# Patient Record
Sex: Male | Born: 1945
Health system: Southern US, Community
[De-identification: ages and names within clinical notes are randomized; demographics above are authoritative.]

## PROBLEM LIST (undated history)

## (undated) DIAGNOSIS — I10 Essential (primary) hypertension: Secondary | ICD-10-CM

## (undated) HISTORY — PX: OTHER SURGICAL HISTORY: SHX169

## (undated) HISTORY — PX: BACK SURGERY: SHX140

---

## 1997-10-13 ENCOUNTER — Ambulatory Visit (HOSPITAL_COMMUNITY): Admission: RE | Admit: 1997-10-13 | Discharge: 1997-10-13 | Payer: Self-pay | Admitting: *Deleted

## 1997-10-27 ENCOUNTER — Ambulatory Visit (HOSPITAL_COMMUNITY): Admission: RE | Admit: 1997-10-27 | Discharge: 1997-10-27 | Payer: Self-pay | Admitting: *Deleted

## 1997-10-27 ENCOUNTER — Encounter: Payer: Self-pay | Admitting: *Deleted

## 1997-11-10 ENCOUNTER — Ambulatory Visit (HOSPITAL_COMMUNITY): Admission: RE | Admit: 1997-11-10 | Discharge: 1997-11-10 | Payer: Self-pay | Admitting: *Deleted

## 1997-11-10 ENCOUNTER — Encounter: Payer: Self-pay | Admitting: *Deleted

## 2003-03-25 ENCOUNTER — Encounter: Admission: RE | Admit: 2003-03-25 | Discharge: 2003-03-25 | Payer: Self-pay | Admitting: Orthopaedic Surgery

## 2003-04-13 ENCOUNTER — Encounter: Admission: RE | Admit: 2003-04-13 | Discharge: 2003-04-13 | Payer: Self-pay | Admitting: Orthopaedic Surgery

## 2003-07-01 ENCOUNTER — Ambulatory Visit (HOSPITAL_COMMUNITY): Admission: RE | Admit: 2003-07-01 | Discharge: 2003-07-01 | Payer: Self-pay | Admitting: Surgery

## 2003-07-01 ENCOUNTER — Ambulatory Visit (HOSPITAL_BASED_OUTPATIENT_CLINIC_OR_DEPARTMENT_OTHER): Admission: RE | Admit: 2003-07-01 | Discharge: 2003-07-01 | Payer: Self-pay | Admitting: Surgery

## 2005-05-03 ENCOUNTER — Encounter: Payer: Self-pay | Admitting: *Deleted

## 2010-01-18 ENCOUNTER — Encounter: Payer: Self-pay | Admitting: Gastroenterology

## 2010-06-27 NOTE — Letter (Signed)
Summary: Colonoscopy Letter  [facility]  [address]   [place], [place] [address]   Phone: [phone]  Fax: [phone]      January 18, 2010 MRN: [id number]   [facility] [address] [place], [place]  [address]   Dear Eric Rogers,   According to your medical record, it is time for you to schedule a Colonoscopy. The American Cancer Society recommends this procedure as a method to detect early colon cancer. Patients with a family history of colon cancer, or a personal history of colon polyps or inflammatory bowel disease are at increased risk.  This letter has been generated based on the recommendations made at the time of your procedure. If you feel that in your particular situation this may no longer apply, please contact our office.  Please call our office at [phone] to schedule this appointment or to update your records at your earliest convenience.  Thank you for cooperating with [place] to provide you with the very best care possible.   Sincerely,   Vania Rea. Jarold Motto, M.D.  [facility] [phone]

## 2011-09-26 ENCOUNTER — Encounter: Payer: Self-pay | Admitting: Gastroenterology

## 2014-03-19 DIAGNOSIS — L812 Freckles: Secondary | ICD-10-CM | POA: Diagnosis not present

## 2014-03-19 DIAGNOSIS — L570 Actinic keratosis: Secondary | ICD-10-CM | POA: Diagnosis not present

## 2014-03-19 DIAGNOSIS — L821 Other seborrheic keratosis: Secondary | ICD-10-CM | POA: Diagnosis not present

## 2014-03-19 DIAGNOSIS — D1801 Hemangioma of skin and subcutaneous tissue: Secondary | ICD-10-CM | POA: Diagnosis not present

## 2014-03-19 DIAGNOSIS — Z85828 Personal history of other malignant neoplasm of skin: Secondary | ICD-10-CM | POA: Diagnosis not present

## 2014-03-19 DIAGNOSIS — L400 Psoriasis vulgaris: Secondary | ICD-10-CM | POA: Diagnosis not present

## 2014-05-20 DIAGNOSIS — M7580 Other shoulder lesions, unspecified shoulder: Secondary | ICD-10-CM | POA: Diagnosis not present

## 2014-05-25 DIAGNOSIS — M25511 Pain in right shoulder: Secondary | ICD-10-CM | POA: Diagnosis not present

## 2014-06-08 DIAGNOSIS — M25511 Pain in right shoulder: Secondary | ICD-10-CM | POA: Diagnosis not present

## 2014-09-09 DIAGNOSIS — H8110 Benign paroxysmal vertigo, unspecified ear: Secondary | ICD-10-CM | POA: Diagnosis not present

## 2014-12-07 DIAGNOSIS — I10 Essential (primary) hypertension: Secondary | ICD-10-CM | POA: Diagnosis not present

## 2014-12-07 DIAGNOSIS — Z1159 Encounter for screening for other viral diseases: Secondary | ICD-10-CM | POA: Diagnosis not present

## 2014-12-07 DIAGNOSIS — Z136 Encounter for screening for cardiovascular disorders: Secondary | ICD-10-CM | POA: Diagnosis not present

## 2014-12-07 DIAGNOSIS — M25511 Pain in right shoulder: Secondary | ICD-10-CM | POA: Diagnosis not present

## 2014-12-07 DIAGNOSIS — Z125 Encounter for screening for malignant neoplasm of prostate: Secondary | ICD-10-CM | POA: Diagnosis not present

## 2014-12-07 DIAGNOSIS — Z23 Encounter for immunization: Secondary | ICD-10-CM | POA: Diagnosis not present

## 2014-12-07 DIAGNOSIS — Z0000 Encounter for general adult medical examination without abnormal findings: Secondary | ICD-10-CM | POA: Diagnosis not present

## 2015-01-06 DIAGNOSIS — M545 Low back pain: Secondary | ICD-10-CM | POA: Diagnosis not present

## 2015-01-25 DIAGNOSIS — M545 Low back pain: Secondary | ICD-10-CM | POA: Diagnosis not present

## 2015-02-08 DIAGNOSIS — M5116 Intervertebral disc disorders with radiculopathy, lumbar region: Secondary | ICD-10-CM | POA: Diagnosis not present

## 2015-02-22 DIAGNOSIS — M545 Low back pain: Secondary | ICD-10-CM | POA: Diagnosis not present

## 2015-02-22 DIAGNOSIS — M25511 Pain in right shoulder: Secondary | ICD-10-CM | POA: Diagnosis not present

## 2015-02-22 DIAGNOSIS — M5116 Intervertebral disc disorders with radiculopathy, lumbar region: Secondary | ICD-10-CM | POA: Diagnosis not present

## 2015-03-08 DIAGNOSIS — M5116 Intervertebral disc disorders with radiculopathy, lumbar region: Secondary | ICD-10-CM | POA: Diagnosis not present

## 2015-03-22 DIAGNOSIS — M545 Low back pain: Secondary | ICD-10-CM | POA: Diagnosis not present

## 2015-04-01 DIAGNOSIS — Z0100 Encounter for examination of eyes and vision without abnormal findings: Secondary | ICD-10-CM | POA: Diagnosis not present

## 2015-04-08 ENCOUNTER — Other Ambulatory Visit: Payer: Self-pay | Admitting: Orthopaedic Surgery

## 2015-04-08 DIAGNOSIS — M545 Low back pain: Secondary | ICD-10-CM

## 2015-04-16 ENCOUNTER — Ambulatory Visit
Admission: RE | Admit: 2015-04-16 | Discharge: 2015-04-16 | Disposition: A | Discharge status: Home / Self Care | Payer: Commercial Managed Care - HMO | Source: Ambulatory Visit | Attending: Orthopaedic Surgery | Admitting: Orthopaedic Surgery

## 2015-04-16 DIAGNOSIS — M545 Low back pain: Secondary | ICD-10-CM

## 2015-04-16 DIAGNOSIS — M5126 Other intervertebral disc displacement, lumbar region: Secondary | ICD-10-CM | POA: Diagnosis not present

## 2015-04-30 DIAGNOSIS — M545 Low back pain: Secondary | ICD-10-CM | POA: Diagnosis not present

## 2015-09-20 DIAGNOSIS — L570 Actinic keratosis: Secondary | ICD-10-CM | POA: Diagnosis not present

## 2015-09-30 DIAGNOSIS — L820 Inflamed seborrheic keratosis: Secondary | ICD-10-CM | POA: Diagnosis not present

## 2015-09-30 DIAGNOSIS — D2321 Other benign neoplasm of skin of right ear and external auricular canal: Secondary | ICD-10-CM | POA: Diagnosis not present

## 2015-09-30 DIAGNOSIS — C4442 Squamous cell carcinoma of skin of scalp and neck: Secondary | ICD-10-CM | POA: Diagnosis not present

## 2015-09-30 DIAGNOSIS — D485 Neoplasm of uncertain behavior of skin: Secondary | ICD-10-CM | POA: Diagnosis not present

## 2015-09-30 DIAGNOSIS — Z85828 Personal history of other malignant neoplasm of skin: Secondary | ICD-10-CM | POA: Diagnosis not present

## 2015-12-09 DIAGNOSIS — Z23 Encounter for immunization: Secondary | ICD-10-CM | POA: Diagnosis not present

## 2015-12-09 DIAGNOSIS — Z125 Encounter for screening for malignant neoplasm of prostate: Secondary | ICD-10-CM | POA: Diagnosis not present

## 2015-12-09 DIAGNOSIS — Z0000 Encounter for general adult medical examination without abnormal findings: Secondary | ICD-10-CM | POA: Diagnosis not present

## 2015-12-09 DIAGNOSIS — I10 Essential (primary) hypertension: Secondary | ICD-10-CM | POA: Diagnosis not present

## 2016-01-31 DIAGNOSIS — Z85828 Personal history of other malignant neoplasm of skin: Secondary | ICD-10-CM | POA: Diagnosis not present

## 2016-01-31 DIAGNOSIS — L820 Inflamed seborrheic keratosis: Secondary | ICD-10-CM | POA: Diagnosis not present

## 2016-01-31 DIAGNOSIS — L570 Actinic keratosis: Secondary | ICD-10-CM | POA: Diagnosis not present

## 2016-08-24 IMAGING — MR MR LUMBAR SPINE W/O CM
5 series · 40 of 48 positions shown · non-contrast
Comparison: MRI dated 09/22/2010

CLINICAL DATA: Chronic low back pain. Numbness and pain in both
legs, right more than left.

EXAM:
MRI LUMBAR SPINE WITHOUT CONTRAST
TECHNIQUE: Multiplanar, multisequence MR imaging of the lumbar spine was
performed. No intravenous contrast was administered.

[Series 3: T2 · sagittal · 4.0mm · 0.91mm/px · 7 of 15 slices shown (1 of 2)]
[im 1/15]
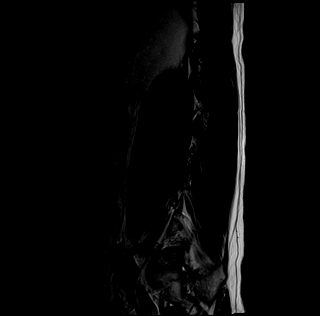
[im 3/15]
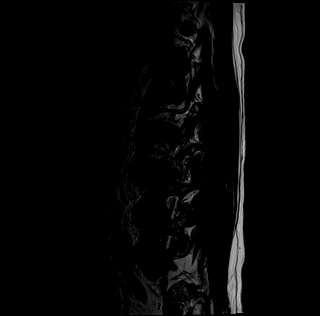
[im 5/15]
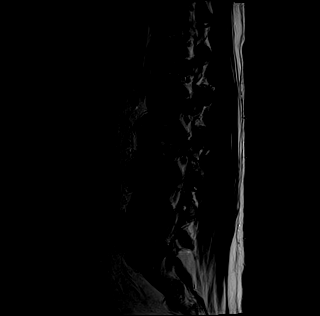
[im 8/15]
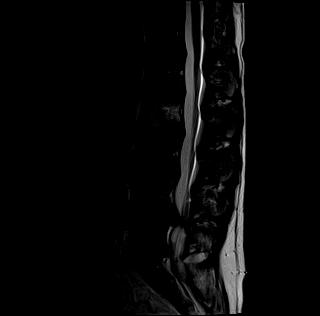
[im 10/15]
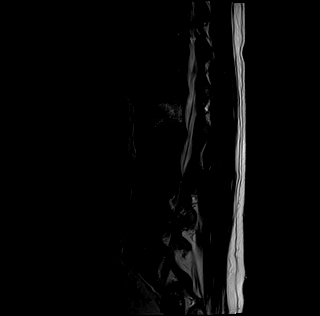
[im 12/15]
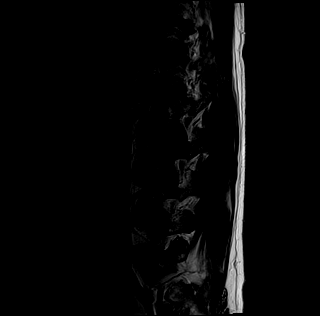
[im 15/15]
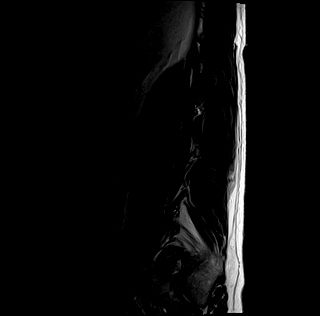

[Series 4: tirm sag · sagittal · 4.0mm · 0.55mm/px · 7 of 15 slices shown]
[im 1/15]
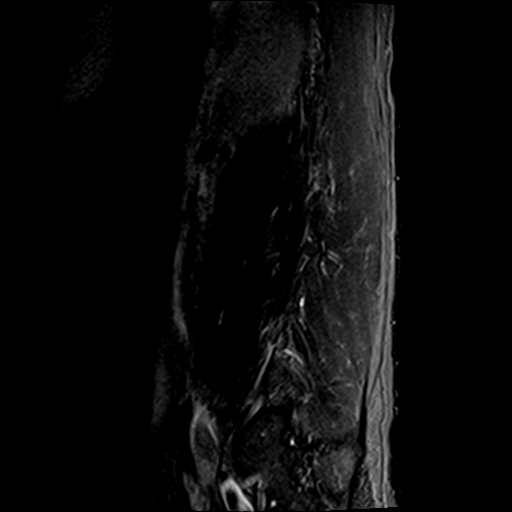
[im 3/15]
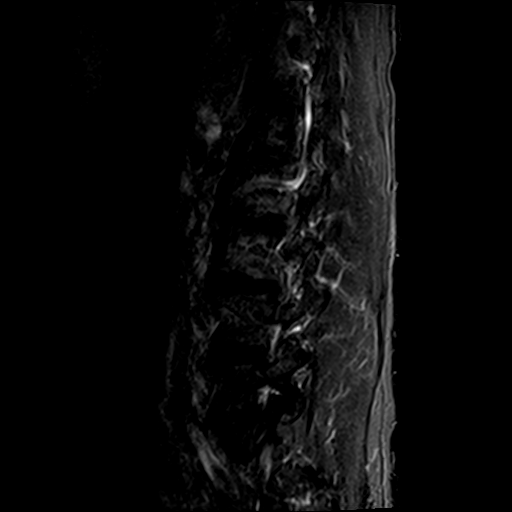
[im 5/15]
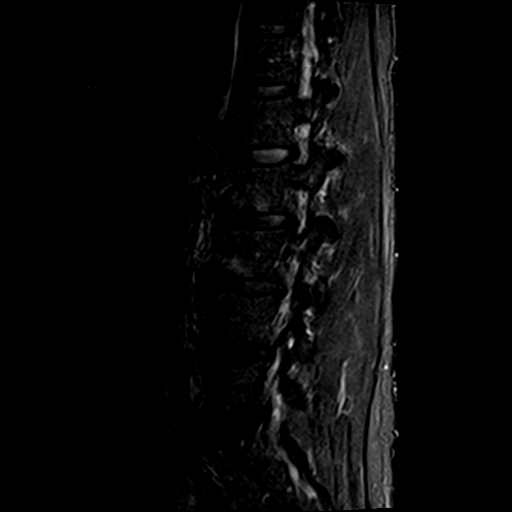
[im 8/15]
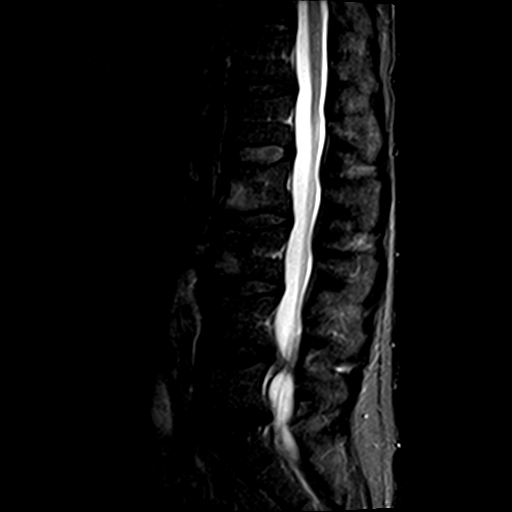
[im 10/15]
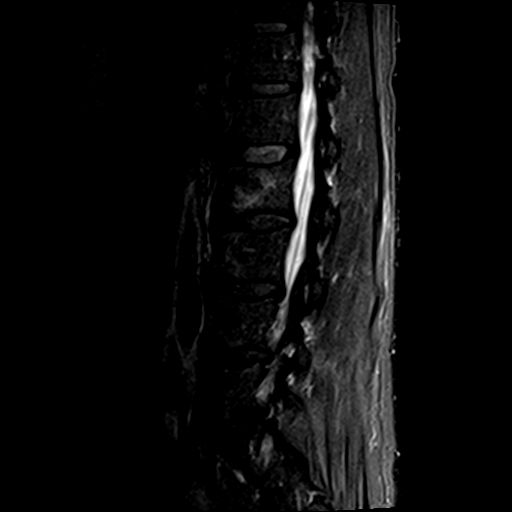
[im 12/15]
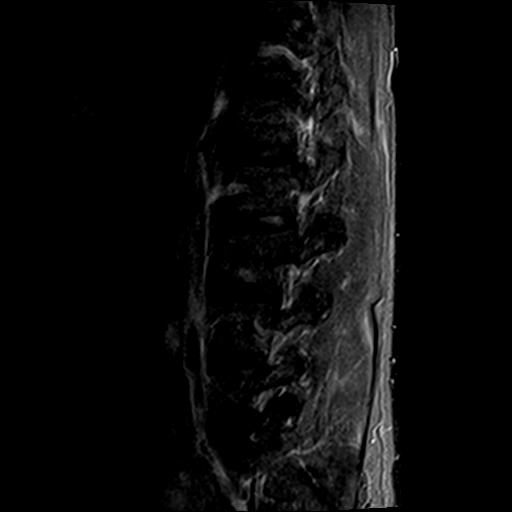
[im 15/15]
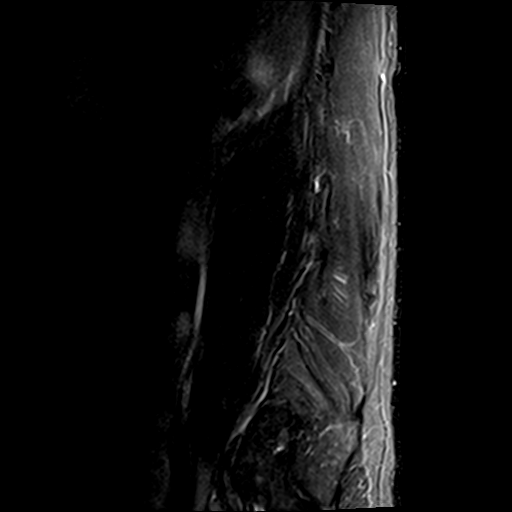

[Series 5: T1 · sagittal · 4.0mm · 0.91mm/px · 6 of 15 slices shown (1 of 2)]
[im 1/15]
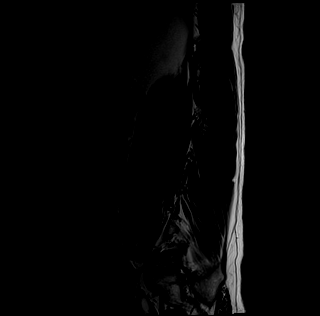
[im 3/15]
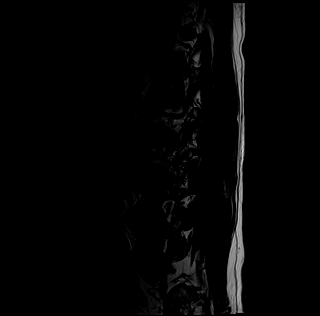
[im 6/15]
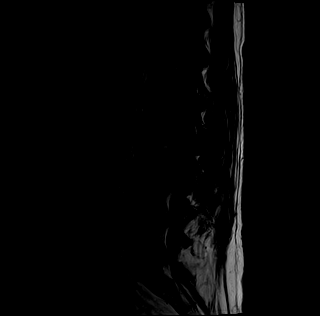
[im 9/15]
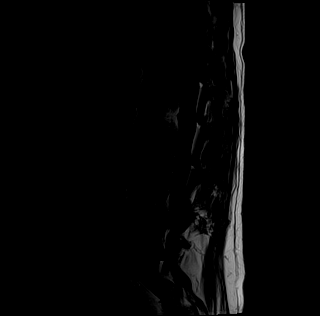
[im 12/15]
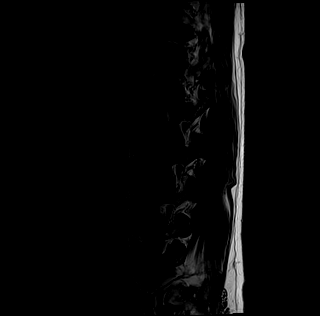
[im 15/15]
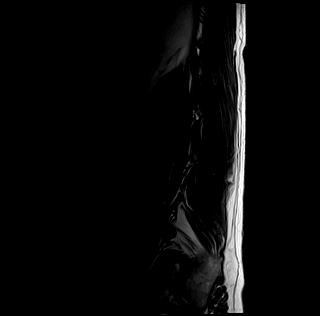

[Series 6: T1 · axial · 4.0mm · 0.70mm/px · z∈[-53,+121]mm · 8 of 34 slices shown (2 of 2)]
[im 1/34]
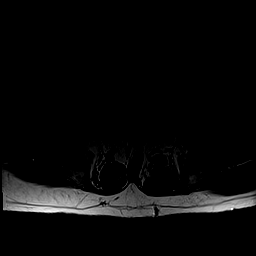
[im 6/34]
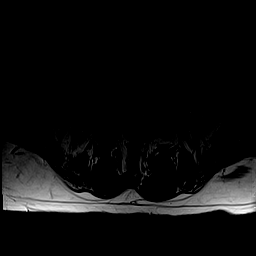
[im 11/34]
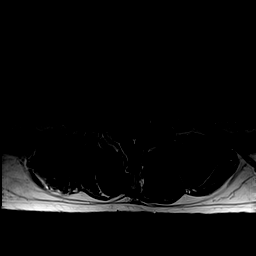
[im 16/34]
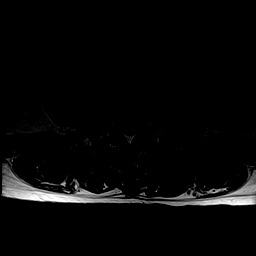
[im 18/34]
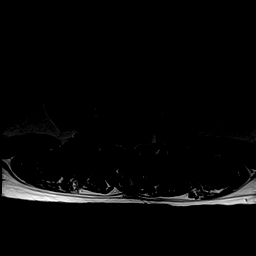
[im 23/34]
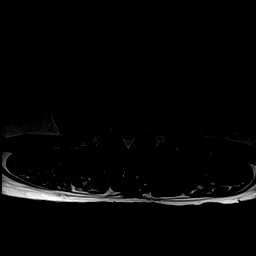
[im 28/34]
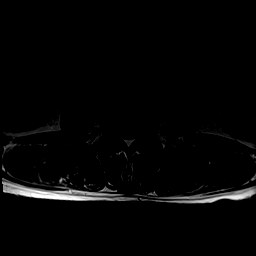
[im 34/34]
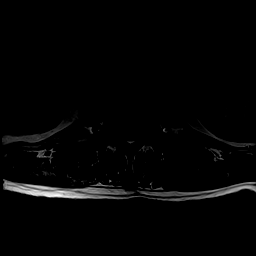

[Series 7: T2 · axial · 4.0mm · 0.70mm/px · z∈[-53,+121]mm · 12 of 34 slices shown (2 of 2)]
[im 1/34]
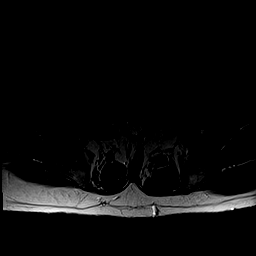
[im 3/34]
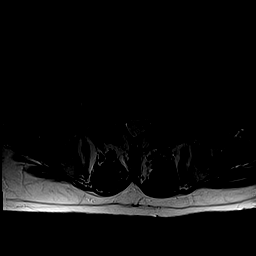
[im 6/34]
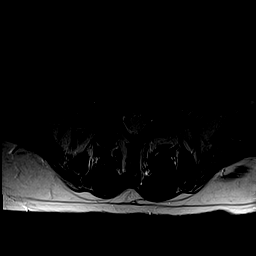
[im 8/34]
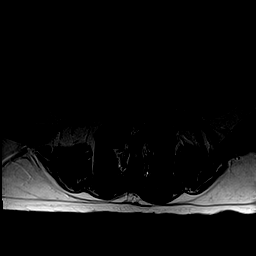
[im 11/34]
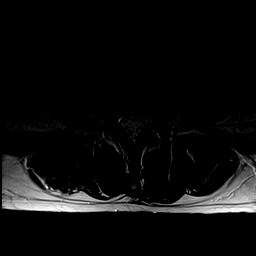
[im 13/34]
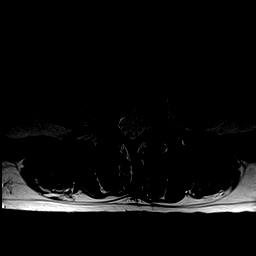
[im 16/34]
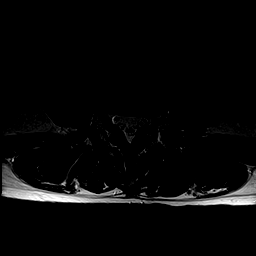
[im 18/34]
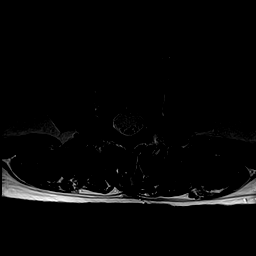
[im 21/34]
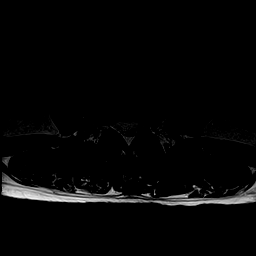
[im 23/34]
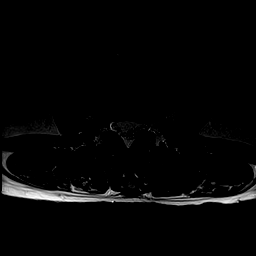
[im 28/34]
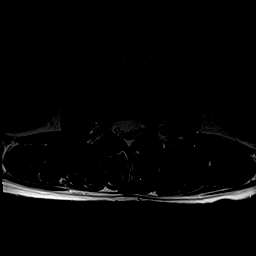
[im 34/34]
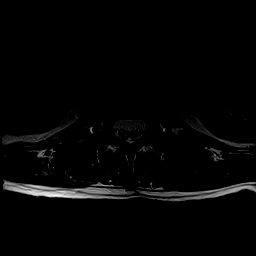

[40 of 48 positions shown; findings below may reference images not displayed]

FINDINGS: Normal conus tip at L1.  Normal paraspinal soft tissues.

T11-12 and T12-L1: Minimal desiccation of the discs. Tiny central
disc bulge at T12-L1 with no neural impingement.

L1-2:  Normal disc.  Chronic benign hemangioma in the L2 vertebra.

L2-3: Tiny broad-based disc bulge with a new tiny disc protrusion
into the left neural foramen slightly compressing the left side of
the thecal sac. This is immediately adjacent to the left L3 nerve
rootlets. Minimal degenerative changes of the facet joints, stable.

L3-4: New small disc bulge into the left neural foramen with
narrowing of the left lateral recess immediately adjacent left L4
nerve rootlets. Minimal stable degenerative changes of the facet
joints.

L4-5: New prominent soft disc extrusion central and to the right
extending inferiorly in the right side of the spinal canal severely
compressing the thecal sac to the right and left of midline
compressing the low L5 nerves in more severely compressing the renal
right L5 and S1 nerve rootlets. Minimal degenerative changes of the
facet joints, stable.

L5-S1: Chronic disc space narrowing with a chronic 6 mm
retrolisthesis. Small broad-based bulge of the uncovered disc
slightly compressing the right S1 nerve root. This is unchanged.
IMPRESSION: 1. New prominent soft disc extrusion central and to the right and
left at L4-5 extending inferiorly on the right behind the body of L5
compressing both L5 nerves and the right S1 nerve and markedly
compressing the thecal sac.
2. New small soft disc protrusion at L2-3 with slight impingement
upon the left lateral recess of the thecal sac.
3. Increased disc bulge to the left at L3-4 with slight compression
of the left lateral recess of the thecal sac.
4. Chronic slight compression of the right S1 nerve root sleeve at
L5-S1.

## 2016-10-12 DIAGNOSIS — H524 Presbyopia: Secondary | ICD-10-CM | POA: Diagnosis not present

## 2016-12-20 DIAGNOSIS — Z125 Encounter for screening for malignant neoplasm of prostate: Secondary | ICD-10-CM | POA: Diagnosis not present

## 2016-12-20 DIAGNOSIS — Z0000 Encounter for general adult medical examination without abnormal findings: Secondary | ICD-10-CM | POA: Diagnosis not present

## 2016-12-20 DIAGNOSIS — Z136 Encounter for screening for cardiovascular disorders: Secondary | ICD-10-CM | POA: Diagnosis not present

## 2016-12-20 DIAGNOSIS — L409 Psoriasis, unspecified: Secondary | ICD-10-CM | POA: Diagnosis not present

## 2016-12-20 DIAGNOSIS — I10 Essential (primary) hypertension: Secondary | ICD-10-CM | POA: Diagnosis not present

## 2017-01-30 DIAGNOSIS — D2371 Other benign neoplasm of skin of right lower limb, including hip: Secondary | ICD-10-CM | POA: Diagnosis not present

## 2017-01-30 DIAGNOSIS — L821 Other seborrheic keratosis: Secondary | ICD-10-CM | POA: Diagnosis not present

## 2017-01-30 DIAGNOSIS — L853 Xerosis cutis: Secondary | ICD-10-CM | POA: Diagnosis not present

## 2017-01-30 DIAGNOSIS — D1801 Hemangioma of skin and subcutaneous tissue: Secondary | ICD-10-CM | POA: Diagnosis not present

## 2017-01-30 DIAGNOSIS — D225 Melanocytic nevi of trunk: Secondary | ICD-10-CM | POA: Diagnosis not present

## 2017-01-30 DIAGNOSIS — D2262 Melanocytic nevi of left upper limb, including shoulder: Secondary | ICD-10-CM | POA: Diagnosis not present

## 2017-01-30 DIAGNOSIS — L400 Psoriasis vulgaris: Secondary | ICD-10-CM | POA: Diagnosis not present

## 2017-01-30 DIAGNOSIS — D2261 Melanocytic nevi of right upper limb, including shoulder: Secondary | ICD-10-CM | POA: Diagnosis not present

## 2017-01-30 DIAGNOSIS — Z85828 Personal history of other malignant neoplasm of skin: Secondary | ICD-10-CM | POA: Diagnosis not present

## 2017-10-18 DIAGNOSIS — H524 Presbyopia: Secondary | ICD-10-CM | POA: Diagnosis not present

## 2018-01-03 DIAGNOSIS — I10 Essential (primary) hypertension: Secondary | ICD-10-CM | POA: Diagnosis not present

## 2018-01-03 DIAGNOSIS — E782 Mixed hyperlipidemia: Secondary | ICD-10-CM | POA: Diagnosis not present

## 2018-01-03 DIAGNOSIS — Z125 Encounter for screening for malignant neoplasm of prostate: Secondary | ICD-10-CM | POA: Diagnosis not present

## 2018-01-03 DIAGNOSIS — Z0000 Encounter for general adult medical examination without abnormal findings: Secondary | ICD-10-CM | POA: Diagnosis not present

## 2018-01-29 DIAGNOSIS — L218 Other seborrheic dermatitis: Secondary | ICD-10-CM | POA: Diagnosis not present

## 2018-01-29 DIAGNOSIS — L821 Other seborrheic keratosis: Secondary | ICD-10-CM | POA: Diagnosis not present

## 2018-01-29 DIAGNOSIS — L570 Actinic keratosis: Secondary | ICD-10-CM | POA: Diagnosis not present

## 2018-01-29 DIAGNOSIS — D1801 Hemangioma of skin and subcutaneous tissue: Secondary | ICD-10-CM | POA: Diagnosis not present

## 2018-01-29 DIAGNOSIS — D485 Neoplasm of uncertain behavior of skin: Secondary | ICD-10-CM | POA: Diagnosis not present

## 2018-01-29 DIAGNOSIS — D2371 Other benign neoplasm of skin of right lower limb, including hip: Secondary | ICD-10-CM | POA: Diagnosis not present

## 2018-01-29 DIAGNOSIS — L820 Inflamed seborrheic keratosis: Secondary | ICD-10-CM | POA: Diagnosis not present

## 2018-01-29 DIAGNOSIS — Z85828 Personal history of other malignant neoplasm of skin: Secondary | ICD-10-CM | POA: Diagnosis not present

## 2018-01-29 DIAGNOSIS — C44319 Basal cell carcinoma of skin of other parts of face: Secondary | ICD-10-CM | POA: Diagnosis not present

## 2018-01-29 DIAGNOSIS — L812 Freckles: Secondary | ICD-10-CM | POA: Diagnosis not present

## 2018-02-28 DIAGNOSIS — Z85828 Personal history of other malignant neoplasm of skin: Secondary | ICD-10-CM | POA: Diagnosis not present

## 2018-02-28 DIAGNOSIS — C44319 Basal cell carcinoma of skin of other parts of face: Secondary | ICD-10-CM | POA: Diagnosis not present

## 2018-07-23 DIAGNOSIS — M25561 Pain in right knee: Secondary | ICD-10-CM | POA: Diagnosis not present

## 2018-08-02 ENCOUNTER — Ambulatory Visit (INDEPENDENT_AMBULATORY_CARE_PROVIDER_SITE_OTHER): Payer: Medicare HMO

## 2018-08-02 ENCOUNTER — Other Ambulatory Visit: Payer: Self-pay

## 2018-08-02 ENCOUNTER — Ambulatory Visit (INDEPENDENT_AMBULATORY_CARE_PROVIDER_SITE_OTHER): Payer: Medicare HMO | Admitting: Orthopaedic Surgery

## 2018-08-02 ENCOUNTER — Encounter: Payer: Self-pay | Admitting: Orthopaedic Surgery

## 2018-08-02 DIAGNOSIS — M25561 Pain in right knee: Secondary | ICD-10-CM | POA: Diagnosis not present

## 2018-08-02 MED ORDER — LIDOCAINE HCL 1 % IJ SOLN
2.0000 mL | INTRAMUSCULAR | Status: AC | PRN
  Administered 2018-08-02: 2 mL

## 2018-08-02 MED ORDER — METHYLPREDNISOLONE ACETATE 40 MG/ML IJ SUSP
40.0000 mg | INTRAMUSCULAR | Status: AC | PRN
  Administered 2018-08-02: 40 mg via INTRA_ARTICULAR

## 2018-08-02 MED ORDER — BUPIVACAINE HCL 0.5 % IJ SOLN
2.0000 mL | INTRAMUSCULAR | Status: AC | PRN
  Administered 2018-08-02: 2 mL via INTRA_ARTICULAR

## 2018-08-02 NOTE — Progress Notes (Signed)
Office Visit Note   Patient: Eric Rogers           Date of Birth: 1945-09-01           MRN: [id number] Visit Date: 08/02/2018              Requested by: No referring provider defined for this encounter. PCP: Kristen Loader, FNP   Assessment & Plan: Visit Diagnoses:  1. Acute pain of right knee     Plan: Impression is right knee pain suspect degenerative medial meniscal tear.  Cortisone injection was performed today.  He can continue using Voltaren gel.  He will take it easy over the next couple months to let the meniscus tear settle down.  Questions encouraged and answered.  He may consider a medial unloader brace for activity.  Follow-up as needed.  Follow-Up Instructions: Return if symptoms worsen or fail to improve.   Orders:  Orders Placed This Encounter  Procedures  . XR KNEE 3 VIEW RIGHT   No orders of the defined types were placed in this encounter.     Procedures: Large Joint Inj: R knee on 08/02/2018 8:57 AM Indications: pain Details: 22 G needle  Arthrogram: No  Medications: 40 mg methylPREDNISolone acetate 40 MG/ML; 2 mL lidocaine 1 %; 2 mL bupivacaine 0.5 % Consent was given by the patient. Patient was prepped and draped in the usual sterile fashion.       Clinical Data: No additional findings.   Subjective: Chief Complaint  Patient presents with  . Right Knee - Pain    Eric Rogers is a 73 year old gentleman who his wife I have been seeing for her knee comes in for evaluation of right knee pain for the last 2 months.  Denies any injuries.  He is quite active and plays pickle ball.  He states that he has a sharp medial knee pain with some constant aching.  The sharp pain is off and on and worse with certain movements and walking on the beach.  He denies any real swelling.  He denies any posterior knee pain.  Denies any giving way or locking.  He has been using Voltaren gel with some partial relief.   Review of Systems  Constitutional:  Negative.   All other systems reviewed and are negative.    Objective: Vital Signs: There were no vitals taken for this visit.  Physical Exam Vitals signs and nursing note reviewed.  Constitutional:      Appearance: He is well-developed.  HENT:     Head: Normocephalic and atraumatic.  Eyes:     Pupils: Pupils are equal, round, and reactive to light.  Neck:     Musculoskeletal: Neck supple.  Pulmonary:     Effort: Pulmonary effort is normal.  Abdominal:     Palpations: Abdomen is soft.  Musculoskeletal: Normal range of motion.  Skin:    General: Skin is warm.  Neurological:     Mental Status: He is alert and oriented to person, place, and time.  Psychiatric:        Behavior: Behavior normal.        Thought Content: Thought content normal.        Judgment: Judgment normal.     Ortho Exam Right knee exam shows a trace joint effusion.  He does have medial joint line tenderness.  Pain with [facility] and [place] testing.  Full range of motion.  Collaterals and cruciates are stable. Specialty Comments:  No specialty comments available.  Imaging:  Xr Knee 3 View Right  Result Date: 08/02/2018 Chondrocalcinosis of the medial and lateral compartments.  No significant DJD.    PMFS History: There are no active problems to display for this patient.  History reviewed. No pertinent past medical history.  History reviewed. No pertinent family history.  History reviewed. No pertinent surgical history. Social History   Occupational History  . Not on file  Tobacco Use  . Smoking status: Not on file  Substance and Sexual Activity  . Alcohol use: Not on file  . Drug use: Not on file  . Sexual activity: Not on file

## 2018-11-11 DIAGNOSIS — H524 Presbyopia: Secondary | ICD-10-CM | POA: Diagnosis not present

## 2019-01-07 DIAGNOSIS — I10 Essential (primary) hypertension: Secondary | ICD-10-CM | POA: Diagnosis not present

## 2019-01-07 DIAGNOSIS — E782 Mixed hyperlipidemia: Secondary | ICD-10-CM | POA: Diagnosis not present

## 2019-01-07 DIAGNOSIS — Z125 Encounter for screening for malignant neoplasm of prostate: Secondary | ICD-10-CM | POA: Diagnosis not present

## 2019-01-07 DIAGNOSIS — Z0000 Encounter for general adult medical examination without abnormal findings: Secondary | ICD-10-CM | POA: Diagnosis not present

## 2019-01-09 DIAGNOSIS — Z125 Encounter for screening for malignant neoplasm of prostate: Secondary | ICD-10-CM | POA: Diagnosis not present

## 2019-01-09 DIAGNOSIS — H524 Presbyopia: Secondary | ICD-10-CM | POA: Diagnosis not present

## 2019-01-09 DIAGNOSIS — H52223 Regular astigmatism, bilateral: Secondary | ICD-10-CM | POA: Diagnosis not present

## 2019-01-09 DIAGNOSIS — Z0000 Encounter for general adult medical examination without abnormal findings: Secondary | ICD-10-CM | POA: Diagnosis not present

## 2019-01-09 DIAGNOSIS — I10 Essential (primary) hypertension: Secondary | ICD-10-CM | POA: Diagnosis not present

## 2019-01-09 DIAGNOSIS — E782 Mixed hyperlipidemia: Secondary | ICD-10-CM | POA: Diagnosis not present

## 2019-01-26 ENCOUNTER — Ambulatory Visit: Payer: Medicare HMO | Attending: Internal Medicine

## 2019-01-26 DIAGNOSIS — Z23 Encounter for immunization: Secondary | ICD-10-CM | POA: Insufficient documentation

## 2019-01-26 NOTE — Progress Notes (Signed)
   Covid-19 Vaccination Clinic  Name:  Eric Rogers    MRN: [id number] DOB: [birth year]  01/26/2019  [name] was observed post Covid-19 immunization for 15 minutes without incidence. He was provided with Vaccine Information Sheet and instruction to access the V-Safe system.   [name] was instructed to call 911 with any severe reactions post vaccine: Marland Kitchen Difficulty breathing  . Swelling of your face and throat  . A fast heartbeat  . A bad rash all over your body  . Dizziness and weakness    Immunizations Administered    Name Date Dose VIS Date Route   Pfizer COVID-19 Vaccine 01/26/2019 10:26 AM 0.3 mL 12/13/2018 Intramuscular   Manufacturer: [place]   Lot: [id number]   [place]: [id number]

## 2019-02-11 DIAGNOSIS — L820 Inflamed seborrheic keratosis: Secondary | ICD-10-CM | POA: Diagnosis not present

## 2019-02-11 DIAGNOSIS — L812 Freckles: Secondary | ICD-10-CM | POA: Diagnosis not present

## 2019-02-11 DIAGNOSIS — L821 Other seborrheic keratosis: Secondary | ICD-10-CM | POA: Diagnosis not present

## 2019-02-11 DIAGNOSIS — D1801 Hemangioma of skin and subcutaneous tissue: Secondary | ICD-10-CM | POA: Diagnosis not present

## 2019-02-11 DIAGNOSIS — D2371 Other benign neoplasm of skin of right lower limb, including hip: Secondary | ICD-10-CM | POA: Diagnosis not present

## 2019-02-11 DIAGNOSIS — Z85828 Personal history of other malignant neoplasm of skin: Secondary | ICD-10-CM | POA: Diagnosis not present

## 2019-02-11 DIAGNOSIS — L570 Actinic keratosis: Secondary | ICD-10-CM | POA: Diagnosis not present

## 2019-02-17 ENCOUNTER — Ambulatory Visit: Payer: Medicare HMO | Attending: Internal Medicine

## 2019-02-17 DIAGNOSIS — Z23 Encounter for immunization: Secondary | ICD-10-CM

## 2019-02-17 NOTE — Progress Notes (Signed)
   Covid-19 Vaccination Clinic  Name:  Eric Rogers    MRN: [id number] DOB: 1945/12/29  02/17/2019  Eric Rogers was observed post Covid-19 immunization for 15 minutes without incidence. He was provided with Vaccine Information Sheet and instruction to access the V-Safe system.   Eric Rogers was instructed to call 911 with any severe reactions post vaccine: Marland Kitchen Difficulty breathing  . Swelling of your face and throat  . A fast heartbeat  . A bad rash all over your body  . Dizziness and weakness    Immunizations Administered    Name Date Dose VIS Date Route   Pfizer COVID-19 Vaccine 02/17/2019  8:22 AM 0.3 mL 12/13/2018 Intramuscular   Manufacturer: [place]   Lot: [id number]   [place]: [id number]

## 2019-08-18 DIAGNOSIS — B349 Viral infection, unspecified: Secondary | ICD-10-CM | POA: Diagnosis not present

## 2019-08-18 DIAGNOSIS — Z03818 Encounter for observation for suspected exposure to other biological agents ruled out: Secondary | ICD-10-CM | POA: Diagnosis not present

## 2019-11-12 DIAGNOSIS — Z0100 Encounter for examination of eyes and vision without abnormal findings: Secondary | ICD-10-CM | POA: Diagnosis not present

## 2020-02-11 DIAGNOSIS — Z125 Encounter for screening for malignant neoplasm of prostate: Secondary | ICD-10-CM | POA: Diagnosis not present

## 2020-02-11 DIAGNOSIS — E782 Mixed hyperlipidemia: Secondary | ICD-10-CM | POA: Diagnosis not present

## 2020-02-11 DIAGNOSIS — Z0000 Encounter for general adult medical examination without abnormal findings: Secondary | ICD-10-CM | POA: Diagnosis not present

## 2020-02-11 DIAGNOSIS — I10 Essential (primary) hypertension: Secondary | ICD-10-CM | POA: Diagnosis not present

## 2020-02-17 DIAGNOSIS — D2371 Other benign neoplasm of skin of right lower limb, including hip: Secondary | ICD-10-CM | POA: Diagnosis not present

## 2020-02-17 DIAGNOSIS — Z85828 Personal history of other malignant neoplasm of skin: Secondary | ICD-10-CM | POA: Diagnosis not present

## 2020-02-17 DIAGNOSIS — L812 Freckles: Secondary | ICD-10-CM | POA: Diagnosis not present

## 2020-02-17 DIAGNOSIS — D1801 Hemangioma of skin and subcutaneous tissue: Secondary | ICD-10-CM | POA: Diagnosis not present

## 2020-02-17 DIAGNOSIS — L570 Actinic keratosis: Secondary | ICD-10-CM | POA: Diagnosis not present

## 2020-02-17 DIAGNOSIS — L218 Other seborrheic dermatitis: Secondary | ICD-10-CM | POA: Diagnosis not present

## 2020-02-17 DIAGNOSIS — L821 Other seborrheic keratosis: Secondary | ICD-10-CM | POA: Diagnosis not present

## 2020-02-17 DIAGNOSIS — L820 Inflamed seborrheic keratosis: Secondary | ICD-10-CM | POA: Diagnosis not present

## 2020-02-17 DIAGNOSIS — L400 Psoriasis vulgaris: Secondary | ICD-10-CM | POA: Diagnosis not present

## 2020-02-18 DIAGNOSIS — I10 Essential (primary) hypertension: Secondary | ICD-10-CM | POA: Diagnosis not present

## 2020-02-18 DIAGNOSIS — Z0000 Encounter for general adult medical examination without abnormal findings: Secondary | ICD-10-CM | POA: Diagnosis not present

## 2020-02-18 DIAGNOSIS — R141 Gas pain: Secondary | ICD-10-CM | POA: Diagnosis not present

## 2020-02-18 DIAGNOSIS — E782 Mixed hyperlipidemia: Secondary | ICD-10-CM | POA: Diagnosis not present

## 2020-03-22 DIAGNOSIS — H52223 Regular astigmatism, bilateral: Secondary | ICD-10-CM | POA: Diagnosis not present

## 2020-03-22 DIAGNOSIS — H524 Presbyopia: Secondary | ICD-10-CM | POA: Diagnosis not present

## 2020-09-16 DIAGNOSIS — I10 Essential (primary) hypertension: Secondary | ICD-10-CM | POA: Diagnosis not present

## 2021-01-26 DIAGNOSIS — H2513 Age-related nuclear cataract, bilateral: Secondary | ICD-10-CM | POA: Diagnosis not present

## 2021-01-26 DIAGNOSIS — H43393 Other vitreous opacities, bilateral: Secondary | ICD-10-CM | POA: Diagnosis not present

## 2021-01-27 DIAGNOSIS — H524 Presbyopia: Secondary | ICD-10-CM | POA: Diagnosis not present

## 2021-01-27 DIAGNOSIS — H52223 Regular astigmatism, bilateral: Secondary | ICD-10-CM | POA: Diagnosis not present

## 2021-02-16 DIAGNOSIS — L821 Other seborrheic keratosis: Secondary | ICD-10-CM | POA: Diagnosis not present

## 2021-02-16 DIAGNOSIS — C44519 Basal cell carcinoma of skin of other part of trunk: Secondary | ICD-10-CM | POA: Diagnosis not present

## 2021-02-16 DIAGNOSIS — D485 Neoplasm of uncertain behavior of skin: Secondary | ICD-10-CM | POA: Diagnosis not present

## 2021-02-16 DIAGNOSIS — Z85828 Personal history of other malignant neoplasm of skin: Secondary | ICD-10-CM | POA: Diagnosis not present

## 2021-02-16 DIAGNOSIS — L400 Psoriasis vulgaris: Secondary | ICD-10-CM | POA: Diagnosis not present

## 2021-02-16 DIAGNOSIS — L570 Actinic keratosis: Secondary | ICD-10-CM | POA: Diagnosis not present

## 2021-02-16 DIAGNOSIS — L812 Freckles: Secondary | ICD-10-CM | POA: Diagnosis not present

## 2021-02-16 DIAGNOSIS — D1801 Hemangioma of skin and subcutaneous tissue: Secondary | ICD-10-CM | POA: Diagnosis not present

## 2021-03-31 DIAGNOSIS — Z0000 Encounter for general adult medical examination without abnormal findings: Secondary | ICD-10-CM | POA: Diagnosis not present

## 2021-03-31 DIAGNOSIS — I10 Essential (primary) hypertension: Secondary | ICD-10-CM | POA: Diagnosis not present

## 2021-03-31 DIAGNOSIS — E782 Mixed hyperlipidemia: Secondary | ICD-10-CM | POA: Diagnosis not present

## 2021-03-31 DIAGNOSIS — R7309 Other abnormal glucose: Secondary | ICD-10-CM | POA: Diagnosis not present

## 2021-03-31 DIAGNOSIS — Z125 Encounter for screening for malignant neoplasm of prostate: Secondary | ICD-10-CM | POA: Diagnosis not present

## 2021-03-31 DIAGNOSIS — Z23 Encounter for immunization: Secondary | ICD-10-CM | POA: Diagnosis not present

## 2021-08-11 DIAGNOSIS — L218 Other seborrheic dermatitis: Secondary | ICD-10-CM | POA: Diagnosis not present

## 2021-08-11 DIAGNOSIS — D225 Melanocytic nevi of trunk: Secondary | ICD-10-CM | POA: Diagnosis not present

## 2021-08-11 DIAGNOSIS — L821 Other seborrheic keratosis: Secondary | ICD-10-CM | POA: Diagnosis not present

## 2021-08-11 DIAGNOSIS — L570 Actinic keratosis: Secondary | ICD-10-CM | POA: Diagnosis not present

## 2021-08-11 DIAGNOSIS — L812 Freckles: Secondary | ICD-10-CM | POA: Diagnosis not present

## 2021-08-11 DIAGNOSIS — Z85828 Personal history of other malignant neoplasm of skin: Secondary | ICD-10-CM | POA: Diagnosis not present

## 2021-08-11 DIAGNOSIS — L308 Other specified dermatitis: Secondary | ICD-10-CM | POA: Diagnosis not present

## 2021-08-11 DIAGNOSIS — D1801 Hemangioma of skin and subcutaneous tissue: Secondary | ICD-10-CM | POA: Diagnosis not present

## 2021-08-11 DIAGNOSIS — L910 Hypertrophic scar: Secondary | ICD-10-CM | POA: Diagnosis not present

## 2021-09-29 DIAGNOSIS — I10 Essential (primary) hypertension: Secondary | ICD-10-CM | POA: Diagnosis not present

## 2021-09-29 DIAGNOSIS — Z6828 Body mass index (BMI) 28.0-28.9, adult: Secondary | ICD-10-CM | POA: Diagnosis not present

## 2022-02-16 DIAGNOSIS — L570 Actinic keratosis: Secondary | ICD-10-CM | POA: Diagnosis not present

## 2022-02-16 DIAGNOSIS — L812 Freckles: Secondary | ICD-10-CM | POA: Diagnosis not present

## 2022-02-16 DIAGNOSIS — D1801 Hemangioma of skin and subcutaneous tissue: Secondary | ICD-10-CM | POA: Diagnosis not present

## 2022-02-16 DIAGNOSIS — L821 Other seborrheic keratosis: Secondary | ICD-10-CM | POA: Diagnosis not present

## 2022-02-16 DIAGNOSIS — Z85828 Personal history of other malignant neoplasm of skin: Secondary | ICD-10-CM | POA: Diagnosis not present

## 2022-02-22 ENCOUNTER — Emergency Department (HOSPITAL_COMMUNITY): Payer: Medicare HMO

## 2022-02-22 ENCOUNTER — Emergency Department (HOSPITAL_COMMUNITY)
Admission: EM | Admit: 2022-02-22 | Discharge: 2022-02-22 | Disposition: A | Discharge status: Home / Self Care | Payer: Medicare HMO | Attending: Emergency Medicine | Admitting: Emergency Medicine

## 2022-02-22 ENCOUNTER — Encounter (HOSPITAL_COMMUNITY): Payer: Self-pay

## 2022-02-22 DIAGNOSIS — K5730 Diverticulosis of large intestine without perforation or abscess without bleeding: Secondary | ICD-10-CM | POA: Diagnosis not present

## 2022-02-22 DIAGNOSIS — N200 Calculus of kidney: Secondary | ICD-10-CM | POA: Diagnosis not present

## 2022-02-22 DIAGNOSIS — R1032 Left lower quadrant pain: Secondary | ICD-10-CM | POA: Diagnosis present

## 2022-02-22 DIAGNOSIS — K8020 Calculus of gallbladder without cholecystitis without obstruction: Secondary | ICD-10-CM | POA: Diagnosis not present

## 2022-02-22 DIAGNOSIS — N132 Hydronephrosis with renal and ureteral calculous obstruction: Secondary | ICD-10-CM | POA: Diagnosis not present

## 2022-02-22 DIAGNOSIS — R109 Unspecified abdominal pain: Secondary | ICD-10-CM | POA: Diagnosis not present

## 2022-02-22 LAB — URINALYSIS, ROUTINE W REFLEX MICROSCOPIC
Bilirubin Urine: NEGATIVE
Glucose, UA: NEGATIVE mg/dL
Ketones, ur: NEGATIVE mg/dL
Leukocytes,Ua: NEGATIVE
Nitrite: NEGATIVE
Protein, ur: NEGATIVE mg/dL
RBC / HPF: >50 RBC/hpf (ref 0–5)
Specific Gravity, Urine: 1.015 (ref 1.005–1.030)
pH: 7 (ref 5.0–8.0)

## 2022-02-22 MED ORDER — ONDANSETRON HCL 4 MG/2ML IJ SOLN
4.0000 mg | Freq: Once | INTRAMUSCULAR | Status: AC
  Administered 2022-02-22: 4 mg via INTRAVENOUS
  Filled 2022-02-22: qty 2

## 2022-02-22 MED ORDER — KETOROLAC TROMETHAMINE 15 MG/ML IJ SOLN
15.0000 mg | Freq: Once | INTRAMUSCULAR | Status: AC
  Administered 2022-02-22: 15 mg via INTRAVENOUS
  Filled 2022-02-22: qty 1

## 2022-02-22 MED ORDER — SODIUM CHLORIDE 0.9 % IV BOLUS
1000.0000 mL | Freq: Once | INTRAVENOUS | Status: AC
  Administered 2022-02-22: 1000 mL via INTRAVENOUS

## 2022-02-22 MED ORDER — MORPHINE SULFATE 15 MG PO TABS: 7.5000 mg | ORAL_TABLET | ORAL | 0 refills | Status: AC | PRN

## 2022-02-22 MED ORDER — TAMSULOSIN HCL 0.4 MG PO CAPS: 0.4000 mg | ORAL_CAPSULE | Freq: Every day | ORAL | 0 refills | Status: DC

## 2022-02-22 MED ORDER — MORPHINE SULFATE (PF) 4 MG/ML IV SOLN
4.0000 mg | Freq: Once | INTRAVENOUS | Status: AC
  Administered 2022-02-22: 4 mg via INTRAVENOUS
  Filled 2022-02-22: qty 1

## 2022-02-22 MED ORDER — ONDANSETRON 4 MG PO TBDP: ORAL_TABLET | ORAL | 0 refills | Status: AC

## 2022-02-22 NOTE — Discharge Instructions (Addendum)
Follow up with the urology office.  Return for uncontrolled pain, fever, inability to eat or drink.   Take 4 over the counter ibuprofen tablets 3 times a day or 2 over-the-counter naproxen tablets twice a day for pain. Also take tylenol 1033m(2 extra strength) four times a day.   Then take the pain medicine if you feel like you need it. Narcotics do not help with the pain, they only make you care about it less.  You can become addicted to this, people may break into your house to steal it.  It will constipate you.  If you drive under the influence of this medicine you can get a DUI.

## 2022-02-22 NOTE — ED Triage Notes (Signed)
Patient arrived stating this morning he began having left sided flank pain after urinating.

## 2022-02-22 NOTE — ED Notes (Signed)
Patient attempted to provide a urine sample but unable to at this time.

## 2022-02-22 NOTE — ED Provider Notes (Signed)
[facility] Provider Note   CSN: [id number] Arrival date & time: 02/22/22  [id number]     History  Chief Complaint  Patient presents with   Flank Pain    Eric Rogers is a 77 y.o. male.  77 yo M with a chief complaint of left flank pain.  This started suddenly about an hour ago.  Left-sided and shoots down into his groin.  He tells me he is having trouble getting comfortable.  Has had some nausea but no vomiting.  Denies urinary symptoms.  He spent a lot of time yesterday trying to get rid of the leaves in his yard.  He otherwise denies trauma.   Flank Pain       Home Medications Prior to Admission medications   Medication Sig Start Date End Date Taking? Authorizing Provider  lisinopril (ZESTRIL) 10 MG tablet Take 10 mg by mouth daily. 01/29/22  Yes [provider]  morphine (MSIR) 15 MG tablet Take 0.5 tablets (7.5 mg total) by mouth every 4 (four) hours as needed for severe pain. 02/22/22  Yes Deno Etienne, DO  ondansetron (ZOFRAN-ODT) 4 MG disintegrating tablet 77m ODT q4 hours prn nausea/vomit 02/22/22  Yes FDeno Etienne DO  tamsulosin (FLOMAX) 0.4 MG CAPS capsule Take 1 capsule (0.4 mg total) by mouth daily after supper. 02/22/22  Yes FDeno Etienne DO  atorvastatin (LIPITOR) 10 MG tablet Take 10 mg by mouth daily.    [provider]  diclofenac sodium (VOLTAREN) 1 % GEL Apply topically 4 (four) times daily.    [provider]  hydrochlorothiazide (HYDRODIURIL) 25 MG tablet TK 1 T PO QD 07/24/18   [provider]  naproxen sodium (ALEVE) 220 MG tablet Take 220 mg by mouth.    [provider]      Allergies    Penicillins and Sulfa antibiotics    Review of Systems   Review of Systems  Genitourinary:  Positive for flank pain.    Physical Exam Updated Vital Signs BP (!) 163/100 (BP Location: Left Arm)   Pulse 78   Temp 97.7 F (36.5 C) (Oral)   Resp 20   SpO2 97%  Physical  Exam Vitals and nursing note reviewed.  Constitutional:      Appearance: He is well-developed.  HENT:     Head: Normocephalic and atraumatic.  Eyes:     Pupils: Pupils are equal, round, and reactive to light.  Neck:     Vascular: No JVD.  Cardiovascular:     Rate and Rhythm: Normal rate and regular rhythm.     Heart sounds: No murmur heard.    No friction rub. No gallop.  Pulmonary:     Effort: No respiratory distress.     Breath sounds: No wheezing.  Abdominal:     General: There is no distension.     Tenderness: There is no abdominal tenderness. There is no guarding or rebound.  Musculoskeletal:        General: Tenderness present. Normal range of motion.     Cervical back: Normal range of motion and neck supple.     Comments: Tenderness just below the costal margin on the left.  No midline spinal tenderness.   Skin:    Coloration: Skin is not pale.     Findings: No rash.  Neurological:     Mental Status: He is alert and oriented to person, place, and time.  Psychiatric:        Behavior:  Behavior normal.     ED Results / Procedures / Treatments   Labs (all labs ordered are listed, but only abnormal results are displayed) Labs Reviewed  URINALYSIS, ROUTINE W REFLEX MICROSCOPIC - Abnormal; Notable for the following components:      Result Value   APPearance HAZY (*)    Hgb urine dipstick LARGE (*)    Bacteria, UA RARE (*)    All other components within normal limits    EKG None  Radiology CT L-SPINE NO CHARGE  Result Date: 02/22/2022 CLINICAL DATA:  77 year old male with left flank pain after urinating. History of low back pain, right greater than left leg pain. EXAM: CT LUMBAR SPINE WITHOUT CONTRAST TECHNIQUE: Technique: Multiplanar CT images of the lumbar spine were reconstructed from contemporary CT of the Abdomen and Pelvis. RADIATION DOSE REDUCTION: This exam was performed according to the departmental dose-optimization program which includes automated exposure  control, adjustment of the mA and/or kV according to patient size and/or use of iterative reconstruction technique. CONTRAST:  None COMPARISON:  CT Abdomen, and Pelvis today reported separately. Lumbar MRI 04/16/2015. FINDINGS: Segmentation: Normal, the same numbering system used on the previous MRI. Alignment: Not significantly changed since the 2017 MRI. Chronic straightening and mild reversal of lumbar lordosis with chronic mild degenerative appearing retrolisthesis of L5 on S1. And subtle chronic dextroconvex lumbar curvature. Vertebrae: Chronic benign L2 vertebral body hemangioma. Stable vertebral height since 2017. Some background osteopenia. No acute osseous abnormality identified. Visible sacrum and SI joints appear intact. Paraspinal and other soft tissues: Abdomen and pelvis visceral findings are detailed separately today. Lumbar paraspinal soft tissues are within normal limits. Disc levels: Visible lower thoracic levels through L3-L4 have not significantly changed since the 2017 MRI, with no associated spinal stenosis. L4-L5 disc degeneration with probable regression of rightward disc herniation seen in 2017. Multifactorial spinal stenosis there appears to be mild or moderate now. L5-S1: Advanced chronic disc space loss, circumferential disc osteophyte complex. Small left paracentral vacuum phenomena is compatible with a small caudal disc herniation in the midline (sagittal image 50). IMPRESSION: 1. No acute osseous abnormality in the Lumbar Spine. Chronic L4-L5 and L5-S1 degeneration. 2. See abnormal CT Abdomen and Pelvis today reported separately. Electronically Signed   By: Genevie Ann M.D.   On: 02/22/2022 05:32   CT Renal Stone Study  Result Date: 02/22/2022 CLINICAL DATA:  77 year old male with left flank pain after urinating. History of low back pain, right greater than left leg pain. EXAM: CT ABDOMEN AND PELVIS WITHOUT CONTRAST TECHNIQUE: Multidetector CT imaging of the abdomen and pelvis was  performed following the standard protocol without IV contrast. RADIATION DOSE REDUCTION: This exam was performed according to the departmental dose-optimization program which includes automated exposure control, adjustment of the mA and/or kV according to patient size and/or use of iterative reconstruction technique. COMPARISON:  Lumbar spine CT today reported separately. Lumbar MRI 04/16/2015. FINDINGS: Lower chest: Negative.  No pericardial or pleural effusion. Hepatobiliary: 11 mm gallstone. No gallbladder dilatation or inflammation. Circumscribed 10 mm simple fluid density subcapsular liver cyst of the left lobe on series 2, image 27 (no follow-up imaging recommended). Elsewhere negative noncontrast liver parenchyma. Pancreas: Negative. Spleen: Negative. Adrenals/Urinary Tract: Normal adrenal glands. Negative noncontrast right kidney.  Decompressed right ureter. Diminutive urinary bladder with generalized mild to moderate bladder wall thickening, trabeculation. Mild left nephromegaly and hydronephrosis with a 6 mm stone at the left ureteropelvic junction on series 2, image 47 and coronal image 85. Additional punctate left  lower pole nephrolithiasis. Superimposed left upper pole intermediate density (23 Hounsfield units) exophytic 4.1 cm indeterminate lesion. This was not included on the 2017 MRI. Distal to the left UPJ calculus the left ureter appears mildly inflamed but decompressed. The distal left ureter is normal. Stomach/Bowel: Moderate to severe diverticulosis of the descending colon, severe in the sigmoid colon. But no active inflammation identified. Redundant transverse colon with mild retained stool. Moderate diverticulosis at the hepatic flexure with no active inflammation. Normal appendix on coronal image 79. No large bowel inflammation. Terminal ileum is within normal limits. Fluid-filled but nondilated small bowel loops throughout the abdomen. Small volume retained fluid in the stomach. Duodenum is  decompressed. No free air, free fluid, or convincing mesenteric inflammation. Vascular/Lymphatic: Normal caliber abdominal aorta. Aortoiliac calcified atherosclerosis. No lymphadenopathy identified. Reproductive: Negative. Other: No pelvis free fluid.  Multiple pelvic phleboliths. Musculoskeletal: Lumbar spine detailed separately. No acute osseous abnormality identified. IMPRESSION: 1. Acute Obstructive Uropathy on the Left due to a 6 mm stone at the UPJ. Punctate additional left lower pole nephrolithiasis. 2. Superimposed indeterminate 4.1 cm left upper pole renal lesion, exophytic and with intermediate density. This might be a proteinaceous cyst, but solid left renal mass is difficult to exclude. Renal Ultrasound with Doppler interrogation of the lesion might be the simplest way to further characterize. 3. Decompressed but trabeculated urinary bladder. Query chronic bladder outlet obstruction or cystitis. 4. Cholelithiasis without evidence of acute cholecystitis. Diverticulosis of the large bowel without active inflammation. Aortic Atherosclerosis (ICD10-I70.0). 5. Lumbar spine CT detailed separately. Electronically Signed   By: Genevie Ann M.D.   On: 02/22/2022 05:27    Procedures Procedures    Medications Ordered in ED Medications  sodium chloride 0.9 % bolus 1,000 mL (1,000 mLs Intravenous New Bag/Given 02/22/22 0451)  morphine (PF) 4 MG/ML injection 4 mg (4 mg Intravenous Given 02/22/22 0451)  ondansetron (ZOFRAN) injection 4 mg (4 mg Intravenous Given 02/22/22 0450)  ketorolac (TORADOL) 15 MG/ML injection 15 mg (15 mg Intravenous Given 02/22/22 0606)    ED Course/ Medical Decision Making/ A&P                             Medical Decision Making Amount and/or Complexity of Data Reviewed Labs: ordered. Radiology: ordered.  Risk Prescription drug management.   77 yo M with a chief complaint of left flank pain.  Patient is presenting somewhat like a kidney stone but also he typically is unable  to reproduce some discomfort with palpation.  CT stone study is consistent with left-sided 6 mm UPJ stone.  Patient was given pain medicines and IV fluids with significant improvement of his symptoms.  Awaiting a UA.  UA negative for infection.  Urology follow up.   6:56 AM:  I have discussed the diagnosis/risks/treatment options with the patient.  Evaluation and diagnostic testing in the emergency department does not suggest an emergent condition requiring admission or immediate intervention beyond what has been performed at this time.  They will follow up with PCP. We also discussed returning to the ED immediately if new or worsening sx occur. We discussed the sx which are most concerning (e.g., sudden worsening pain, fever, inability to tolerate by mouth) that necessitate immediate return. Medications administered to the patient during their visit and any new prescriptions provided to the patient are listed below.  Medications given during this visit Medications  sodium chloride 0.9 % bolus 1,000 mL (1,000 mLs Intravenous New Bag/Given  02/22/22 0451)  morphine (PF) 4 MG/ML injection 4 mg (4 mg Intravenous Given 02/22/22 0451)  ondansetron (ZOFRAN) injection 4 mg (4 mg Intravenous Given 02/22/22 0450)  ketorolac (TORADOL) 15 MG/ML injection 15 mg (15 mg Intravenous Given 02/22/22 0606)     The patient appears reasonably screen and/or stabilized for discharge and I doubt any other medical condition or other [facility] requiring further screening, evaluation, or treatment in the ED at this time prior to discharge.          Final Clinical Impression(s) / ED Diagnoses Final diagnoses:  Nephrolithiasis    Rx / DC Orders ED Discharge Orders          Ordered    morphine (MSIR) 15 MG tablet  Every 4 hours PRN        02/22/22 0639    tamsulosin (FLOMAX) 0.4 MG CAPS capsule  Daily after supper        02/22/22 0639    ondansetron (ZOFRAN-ODT) 4 MG disintegrating tablet        02/22/22 [place], Wann, DO 02/22/22 [phone]

## 2022-02-23 DIAGNOSIS — N202 Calculus of kidney with calculus of ureter: Secondary | ICD-10-CM | POA: Diagnosis not present

## 2022-03-16 DIAGNOSIS — N201 Calculus of ureter: Secondary | ICD-10-CM | POA: Diagnosis not present

## 2022-03-21 ENCOUNTER — Encounter (HOSPITAL_BASED_OUTPATIENT_CLINIC_OR_DEPARTMENT_OTHER): Payer: Self-pay | Admitting: Urology

## 2022-03-21 ENCOUNTER — Other Ambulatory Visit: Payer: Self-pay | Admitting: Urology

## 2022-03-21 NOTE — Progress Notes (Signed)
Pt returning call about upcoming ESWL procedure 03/24/22. Reviewed history, allergies, and medications. Pt aware to hold ASA, NSAIDs, and fish oil/supplements until after procedure. Pt instructed to arrive 0645, location reviewed. NPO after midnight, including gum/candy, with small sip of water with meds. Pre-op instructions reviewed. Pt to bring blue folder and license/insurance. Spouse to be driver/caregiver. Pt verbalized understanding and states no further questions.

## 2022-03-21 NOTE — Progress Notes (Signed)
Left voicemail to return call. 

## 2022-03-24 ENCOUNTER — Ambulatory Visit (HOSPITAL_BASED_OUTPATIENT_CLINIC_OR_DEPARTMENT_OTHER)
Admission: RE | Admit: 2022-03-24 | Discharge: 2022-03-24 | Disposition: A | Discharge status: Home / Self Care | Payer: Medicare HMO | Attending: Urology | Admitting: Urology

## 2022-03-24 ENCOUNTER — Encounter (HOSPITAL_BASED_OUTPATIENT_CLINIC_OR_DEPARTMENT_OTHER): Payer: Self-pay | Admitting: Urology

## 2022-03-24 ENCOUNTER — Ambulatory Visit (HOSPITAL_COMMUNITY): Payer: Medicare HMO

## 2022-03-24 ENCOUNTER — Encounter (HOSPITAL_BASED_OUTPATIENT_CLINIC_OR_DEPARTMENT_OTHER)
Admission: RE | Disposition: A | Discharge status: Home / Self Care | Payer: Self-pay | Source: Home / Self Care | Attending: Urology

## 2022-03-24 ENCOUNTER — Other Ambulatory Visit: Payer: Self-pay

## 2022-03-24 DIAGNOSIS — N201 Calculus of ureter: Secondary | ICD-10-CM | POA: Diagnosis not present

## 2022-03-24 HISTORY — DX: Essential (primary) hypertension: I10

## 2022-03-24 HISTORY — PX: EXTRACORPOREAL SHOCK WAVE LITHOTRIPSY: SHX1557

## 2022-03-24 SURGERY — LITHOTRIPSY, ESWL
Anesthesia: LOCAL | Laterality: Left

## 2022-03-24 MED ORDER — CIPROFLOXACIN HCL 500 MG PO TABS
ORAL_TABLET | ORAL | Status: AC
  Filled 2022-03-24: qty 1

## 2022-03-24 MED ORDER — DIPHENHYDRAMINE HCL 25 MG PO CAPS
ORAL_CAPSULE | ORAL | Status: AC
  Filled 2022-03-24: qty 1

## 2022-03-24 MED ORDER — OXYCODONE HCL 5 MG PO TABS: 5.0000 mg | ORAL_TABLET | Freq: Four times a day (QID) | ORAL | 0 refills | Status: AC | PRN

## 2022-03-24 MED ORDER — DIAZEPAM 5 MG PO TABS
10.0000 mg | ORAL_TABLET | ORAL | Status: AC
  Administered 2022-03-24: 10 mg via ORAL

## 2022-03-24 MED ORDER — DIPHENHYDRAMINE HCL 25 MG PO CAPS
25.0000 mg | ORAL_CAPSULE | ORAL | Status: AC
  Administered 2022-03-24: 25 mg via ORAL

## 2022-03-24 MED ORDER — TAMSULOSIN HCL 0.4 MG PO CAPS: 0.4000 mg | ORAL_CAPSULE | Freq: Every day | ORAL | 0 refills | Status: AC

## 2022-03-24 MED ORDER — DIAZEPAM 5 MG PO TABS
ORAL_TABLET | ORAL | Status: AC
  Filled 2022-03-24: qty 2

## 2022-03-24 MED ORDER — SODIUM CHLORIDE 0.9 % IV SOLN
INTRAVENOUS | Status: DC
  Administered 2022-03-24: 1000 mL via INTRAVENOUS

## 2022-03-24 MED ORDER — CIPROFLOXACIN HCL 500 MG PO TABS
500.0000 mg | ORAL_TABLET | ORAL | Status: AC
  Administered 2022-03-24: 500 mg via ORAL

## 2022-03-24 NOTE — Op Note (Signed)
See [employer] operative note scanned into chart. Also because of the size, density, location and other factors that cannot be anticipated I feel this will likely be a staged procedure. This fact supersedes any indication in the scanned [place] stone operative note to the contrary.  Donald Pore MD 03/24/2022, 9:46 AM  [facility]  Pager: [phone]

## 2022-03-24 NOTE — H&P (Signed)
H&P  History of Present Illness: Eric Rogers is a 77 y.o. year old L who presents today for ESWL to address a left ureteral stone  Past Medical History:  Diagnosis Date   Hypertension     Past Surgical History:  Procedure Laterality Date   BACK SURGERY     age 40, L4-5 discectomy   lipoma removal     ~2014, from shoulder    Home Medications:  Current Meds  Medication Sig   aspirin EC 81 MG tablet Take 81 mg by mouth daily. Swallow whole.   atorvastatin (LIPITOR) 10 MG tablet Take 10 mg by mouth daily.   hydrochlorothiazide (HYDRODIURIL) 25 MG tablet TK 1 T PO QD   ibuprofen (ADVIL) 200 MG tablet Take 400 mg by mouth every 6 (six) hours as needed for moderate pain.   lisinopril (ZESTRIL) 10 MG tablet Take 10 mg by mouth daily.   Multiple Vitamin (MULTIVITAMIN ADULT PO) Take by mouth.   Omega-3 Fatty Acids (FISH OIL PO) Take by mouth.   tamsulosin (FLOMAX) 0.4 MG CAPS capsule Take 1 capsule (0.4 mg total) by mouth daily after supper.    Allergies:  Allergies  Allergen Reactions   Penicillins     Childhood - mouth turned "raw"   Sulfa Antibiotics     Childhood - mouth turned "raw"    History reviewed. No pertinent family history.  Social History:  reports that he has never smoked. He has never used smokeless tobacco. He reports current alcohol use. He reports that he does not use drugs.  ROS: A complete review of systems was performed.  All systems are negative except for pertinent findings as noted.  Physical Exam:  Vital signs in last 24 hours: Temp:  [98.2 F (36.8 C)] 98.2 F (36.8 C) (03/22 0750) Pulse Rate:  [66] 66 (03/22 0750) Resp:  [17] 17 (03/22 0750) BP: (108)/(65) 108/65 (03/22 0750) SpO2:  [99 %] 99 % (03/22 0750) Weight:  [90.6 kg] 90.6 kg (03/22 0750) Constitutional:  Alert and oriented, No acute distress Cardiovascular: Regular rate and rhythm Respiratory: Normal respiratory effort, Lungs clear bilaterally GI: Abdomen is soft,  nontender, nondistended, no abdominal masses Lymphatic: No lymphadenopathy Neurologic: Grossly intact, no focal deficits Psychiatric: Normal mood and affect   Laboratory Data:  No results for input(s): "WBC", "HGB", "HCT", "PLT" in the last 72 hours.  No results for input(s): "NA", "K", "CL", "GLUCOSE", "BUN", "CALCIUM", "CREATININE" in the last 72 hours.  Invalid input(s): "CO3"   No results found for this or any previous visit (from the past 24 hour(s)). No results found for this or any previous visit (from the past 240 hour(s)).  Renal Function: No results for input(s): "CREATININE" in the last 168 hours. CrCl cannot be calculated (No successful lab value found.).  Radiologic Imaging: No results found.  Assessment:  Eric Rogers is a 77 y.o. year old with left ureteral stone  Plan:  To OR as planned for ESWL. Procedure and risks reviewed  Donald Pore, MD 03/24/2022, [facility] Pager: [phone]

## 2022-03-24 NOTE — Discharge Instructions (Signed)
1. You should strain your urine and collect all fragments and bring them to your follow up appointment.  °2. You should take your pain medication as needed.  Please call if your pain is severe to the point that it is not controlled with your pain medication. °3. You should call if you develop fever > 101 or persistent nausea or vomiting. °4. Your doctor may prescribe tamsulosin to take to help facilitate stone passage. °

## 2022-03-27 ENCOUNTER — Encounter (HOSPITAL_BASED_OUTPATIENT_CLINIC_OR_DEPARTMENT_OTHER): Payer: Self-pay | Admitting: Urology

## 2022-03-28 NOTE — Op Note (Deleted)
See [employer] operative note scanned into chart. Also because of the size, density, location and other factors that cannot be anticipated I feel this will likely be a staged procedure. This fact supersedes any indication in the scanned [place] stone operative note to the contrary.  Donald Pore MD 03/28/2022, 9:45 AM  [facility]  Pager: [phone]

## 2022-04-06 DIAGNOSIS — E782 Mixed hyperlipidemia: Secondary | ICD-10-CM | POA: Diagnosis not present

## 2022-04-06 DIAGNOSIS — I10 Essential (primary) hypertension: Secondary | ICD-10-CM | POA: Diagnosis not present

## 2022-04-06 DIAGNOSIS — Z125 Encounter for screening for malignant neoplasm of prostate: Secondary | ICD-10-CM | POA: Diagnosis not present

## 2022-04-06 DIAGNOSIS — Z0000 Encounter for general adult medical examination without abnormal findings: Secondary | ICD-10-CM | POA: Diagnosis not present

## 2022-04-13 DIAGNOSIS — N201 Calculus of ureter: Secondary | ICD-10-CM | POA: Diagnosis not present

## 2022-05-15 DIAGNOSIS — N201 Calculus of ureter: Secondary | ICD-10-CM | POA: Diagnosis not present

## 2022-08-17 DIAGNOSIS — Z85828 Personal history of other malignant neoplasm of skin: Secondary | ICD-10-CM | POA: Diagnosis not present

## 2022-08-17 DIAGNOSIS — L308 Other specified dermatitis: Secondary | ICD-10-CM | POA: Diagnosis not present

## 2022-08-17 DIAGNOSIS — D1801 Hemangioma of skin and subcutaneous tissue: Secondary | ICD-10-CM | POA: Diagnosis not present

## 2022-08-17 DIAGNOSIS — L570 Actinic keratosis: Secondary | ICD-10-CM | POA: Diagnosis not present

## 2022-08-17 DIAGNOSIS — L814 Other melanin hyperpigmentation: Secondary | ICD-10-CM | POA: Diagnosis not present

## 2022-08-17 DIAGNOSIS — D692 Other nonthrombocytopenic purpura: Secondary | ICD-10-CM | POA: Diagnosis not present

## 2022-08-17 DIAGNOSIS — L821 Other seborrheic keratosis: Secondary | ICD-10-CM | POA: Diagnosis not present

## 2022-10-12 DIAGNOSIS — E782 Mixed hyperlipidemia: Secondary | ICD-10-CM | POA: Diagnosis not present

## 2022-10-12 DIAGNOSIS — I10 Essential (primary) hypertension: Secondary | ICD-10-CM | POA: Diagnosis not present

## 2022-11-16 DIAGNOSIS — N200 Calculus of kidney: Secondary | ICD-10-CM | POA: Diagnosis not present

## 2023-03-01 DIAGNOSIS — L812 Freckles: Secondary | ICD-10-CM | POA: Diagnosis not present

## 2023-03-01 DIAGNOSIS — L821 Other seborrheic keratosis: Secondary | ICD-10-CM | POA: Diagnosis not present

## 2023-03-01 DIAGNOSIS — L308 Other specified dermatitis: Secondary | ICD-10-CM | POA: Diagnosis not present

## 2023-03-01 DIAGNOSIS — Z85828 Personal history of other malignant neoplasm of skin: Secondary | ICD-10-CM | POA: Diagnosis not present

## 2023-03-01 DIAGNOSIS — D1801 Hemangioma of skin and subcutaneous tissue: Secondary | ICD-10-CM | POA: Diagnosis not present

## 2023-03-01 DIAGNOSIS — L570 Actinic keratosis: Secondary | ICD-10-CM | POA: Diagnosis not present

## 2023-03-20 DIAGNOSIS — R1084 Generalized abdominal pain: Secondary | ICD-10-CM | POA: Diagnosis not present

## 2023-03-23 DIAGNOSIS — H43393 Other vitreous opacities, bilateral: Secondary | ICD-10-CM | POA: Diagnosis not present

## 2023-03-23 DIAGNOSIS — H2513 Age-related nuclear cataract, bilateral: Secondary | ICD-10-CM | POA: Diagnosis not present

## 2023-05-03 DIAGNOSIS — E782 Mixed hyperlipidemia: Secondary | ICD-10-CM | POA: Diagnosis not present

## 2023-05-03 DIAGNOSIS — Z125 Encounter for screening for malignant neoplasm of prostate: Secondary | ICD-10-CM | POA: Diagnosis not present

## 2023-05-03 DIAGNOSIS — L409 Psoriasis, unspecified: Secondary | ICD-10-CM | POA: Diagnosis not present

## 2023-05-03 DIAGNOSIS — Z0000 Encounter for general adult medical examination without abnormal findings: Secondary | ICD-10-CM | POA: Diagnosis not present

## 2023-05-03 DIAGNOSIS — I10 Essential (primary) hypertension: Secondary | ICD-10-CM | POA: Diagnosis not present

## 2023-05-03 DIAGNOSIS — K5730 Diverticulosis of large intestine without perforation or abscess without bleeding: Secondary | ICD-10-CM | POA: Diagnosis not present

## 2023-05-03 DIAGNOSIS — Z6827 Body mass index (BMI) 27.0-27.9, adult: Secondary | ICD-10-CM | POA: Diagnosis not present

## 2023-05-03 DIAGNOSIS — Z1211 Encounter for screening for malignant neoplasm of colon: Secondary | ICD-10-CM | POA: Diagnosis not present

## 2023-08-21 ENCOUNTER — Emergency Department (HOSPITAL_COMMUNITY)
Admission: EM | Admit: 2023-08-21 | Discharge: 2023-08-21 | Disposition: A | Discharge status: Home / Self Care | Attending: Emergency Medicine | Admitting: Emergency Medicine

## 2023-08-21 ENCOUNTER — Encounter (HOSPITAL_COMMUNITY): Payer: Self-pay

## 2023-08-21 ENCOUNTER — Other Ambulatory Visit: Payer: Self-pay

## 2023-08-21 DIAGNOSIS — I10 Essential (primary) hypertension: Secondary | ICD-10-CM | POA: Diagnosis not present

## 2023-08-21 DIAGNOSIS — S0101XA Laceration without foreign body of scalp, initial encounter: Secondary | ICD-10-CM | POA: Insufficient documentation

## 2023-08-21 DIAGNOSIS — S0990XA Unspecified injury of head, initial encounter: Secondary | ICD-10-CM | POA: Diagnosis present

## 2023-08-21 DIAGNOSIS — Z7982 Long term (current) use of aspirin: Secondary | ICD-10-CM | POA: Diagnosis not present

## 2023-08-21 DIAGNOSIS — W208XXA Other cause of strike by thrown, projected or falling object, initial encounter: Secondary | ICD-10-CM | POA: Insufficient documentation

## 2023-08-21 DIAGNOSIS — Y92239 Unspecified place in hospital as the place of occurrence of the external cause: Secondary | ICD-10-CM | POA: Insufficient documentation

## 2023-08-21 DIAGNOSIS — Z79899 Other long term (current) drug therapy: Secondary | ICD-10-CM | POA: Insufficient documentation

## 2023-08-21 DIAGNOSIS — Y9384 Activity, sleeping: Secondary | ICD-10-CM | POA: Insufficient documentation

## 2023-08-21 NOTE — Discharge Instructions (Signed)
 As we discussed, you have a laceration and I have applied Dermabond.  The Dermabond will fall off and about a week.  Please do not wash your hair today but you may wash it tomorrow.  Take Tylenol and Motrin for pain  See your doctor for follow up  Return to ER if you have headache or vomiting purulent discharge from the

## 2023-08-21 NOTE — ED Provider Notes (Signed)
 [facility] EMERGENCY DEPARTMENT AT [facility] Provider Note   CSN: [id number] Arrival date & time: 08/21/23  9178     Patient presents with: Laceration   Eric Rogers is a 78 y.o. male hx of HTN, here presenting with scalp laceration.  Patient states that he was in the hospital with his wife.  He was sleeping last night and lamp stand fell and hit his scalp this morning.  His tetanus is updated 2 years ago.  Patient has no headache or vomiting.  Patient is not on blood thinners.  He states that the bleeding is under control.   The history is provided by the patient.       Prior to Admission medications   Medication Sig Start Date End Date Taking? Authorizing Provider  aspirin EC 81 MG tablet Take 81 mg by mouth daily. Swallow whole.    [provider]  atorvastatin (LIPITOR) 10 MG tablet Take 10 mg by mouth daily.    [provider]  diclofenac sodium (VOLTAREN) 1 % GEL Apply topically 4 (four) times daily. Patient not taking: Reported on 03/21/2022    [provider]  hydrochlorothiazide (HYDRODIURIL) 25 MG tablet TK 1 T PO QD 07/24/18   [provider]  ibuprofen (ADVIL) 200 MG tablet Take 400 mg by mouth every 6 (six) hours as needed for moderate pain.    [provider]  lisinopril (ZESTRIL) 10 MG tablet Take 10 mg by mouth daily. 01/29/22   [provider]  morphine  (MSIR) 15 MG tablet Take 0.5 tablets (7.5 mg total) by mouth every 4 (four) hours as needed for severe pain. 02/22/22   Floyd, Dan, DO  Multiple Vitamin (MULTIVITAMIN ADULT PO) Take by mouth.    [provider]  naproxen sodium (ALEVE) 220 MG tablet Take 220 mg by mouth. Patient not taking: Reported on 03/21/2022    [provider]  Omega-3 Fatty Acids (FISH OIL PO) Take by mouth.    [provider]  ondansetron  (ZOFRAN -ODT) 4 MG disintegrating tablet 4mg  ODT q4 hours prn nausea/vomit 02/22/22   Emil Share, DO  oxyCODONE   (ROXICODONE ) 5 MG immediate release tablet Take 1 tablet (5 mg total) by mouth every 6 (six) hours as needed for severe pain. 03/24/22   Lovie Arlyss CROME, MD  tamsulosin  (FLOMAX ) 0.4 MG CAPS capsule Take 1 capsule (0.4 mg total) by mouth daily after supper. 03/24/22   Lovie Arlyss CROME, MD    Allergies: Penicillins and Sulfa antibiotics    Review of Systems  Skin:  Positive for wound.  All other systems reviewed and are negative.   Updated Vital Signs BP (!) 156/94 (BP Location: Left Arm)   Pulse 66   Temp 98.2 F (36.8 C) (Oral)   Resp 16   SpO2 98%   Physical Exam Vitals and nursing note reviewed.  Constitutional:      Appearance: Normal appearance.  HENT:     Head:     Comments: 6 cm laceration superior scalp     Mouth/Throat:     Mouth: Mucous membranes are moist.  Eyes:     Extraocular Movements: Extraocular movements intact.     Pupils: Pupils are equal, round, and reactive to light.  Neck:     Comments: No midline tenderness  Cardiovascular:     Rate and Rhythm: Normal rate and regular rhythm.     Pulses: Normal pulses.     Heart sounds: Normal heart sounds.  Pulmonary:  Effort: Pulmonary effort is normal.     Breath sounds: Normal breath sounds.  Abdominal:     General: Abdomen is flat.     Palpations: Abdomen is soft.  Musculoskeletal:        General: Normal range of motion.     Cervical back: Normal range of motion and neck supple.  Skin:    General: Skin is warm.     Capillary Refill: Capillary refill takes less than 2 seconds.  Neurological:     General: No focal deficit present.     Mental Status: He is alert and oriented to person, place, and time.     Cranial Nerves: No cranial nerve deficit.     Sensory: No sensory deficit.     Motor: No weakness.     Coordination: Coordination normal.  Psychiatric:        Mood and Affect: Mood normal.        Behavior: Behavior normal.     (all labs ordered are listed, but only abnormal results are  displayed) Labs Reviewed - No data to display  EKG: None  Radiology: No results found.   Procedures   LACERATION REPAIR Performed by: Alm VEAR Cave Authorized by: Alm VEAR Cave Consent: Verbal consent obtained. Risks and benefits: risks, benefits and alternatives were discussed Consent given by: patient Patient identity confirmed: provided demographic data Prepped and Draped in normal sterile fashion Wound explored  Laceration Location: superior scalp   Laceration Length: 6 cm  No Foreign Bodies seen or palpated  Anesthesia: none    Irrigation method: syringe Amount of cleaning: standard  Skin closure: dermabond    Technique: dermabond   Patient tolerance: Patient tolerated the procedure well with no immediate complications.   Medications Ordered in the ED - No data to display                                  Medical Decision Making Eric Rogers is a 78 y.o. male here presenting with scalp laceration after a lamp fell on his head.  Bleeding is controlled and he has a bald spot around that area.  Offered Dermabond versus staple versus sutures and he agreed with Dermabond.  Patient's tetanus is up-to-date.  No need for CT head as patient has nonfocal neuroexam and is not on blood thinners.  Gave strict return precaution   Problems Addressed: Laceration of scalp, initial encounter: acute illness or injury    Final diagnoses:  None    ED Discharge Orders     None          Cave Alm Macho, MD 08/21/23 [phone]

## 2023-08-21 NOTE — ED Triage Notes (Signed)
 Pt was with his wife upstairs as a visitor and a light came down and hit him in the top of his head. Pt has a laceration to the top of his head.

## 2023-09-06 DIAGNOSIS — Z85828 Personal history of other malignant neoplasm of skin: Secondary | ICD-10-CM | POA: Diagnosis not present

## 2023-09-06 DIAGNOSIS — L308 Other specified dermatitis: Secondary | ICD-10-CM | POA: Diagnosis not present

## 2023-09-06 DIAGNOSIS — L812 Freckles: Secondary | ICD-10-CM | POA: Diagnosis not present

## 2023-09-06 DIAGNOSIS — L821 Other seborrheic keratosis: Secondary | ICD-10-CM | POA: Diagnosis not present

## 2023-09-06 DIAGNOSIS — L570 Actinic keratosis: Secondary | ICD-10-CM | POA: Diagnosis not present

## 2023-09-06 DIAGNOSIS — D1801 Hemangioma of skin and subcutaneous tissue: Secondary | ICD-10-CM | POA: Diagnosis not present

## 2023-09-06 DIAGNOSIS — L720 Epidermal cyst: Secondary | ICD-10-CM | POA: Diagnosis not present

## 2023-10-10 DIAGNOSIS — I10 Essential (primary) hypertension: Secondary | ICD-10-CM | POA: Diagnosis not present

## 2023-10-10 DIAGNOSIS — E782 Mixed hyperlipidemia: Secondary | ICD-10-CM | POA: Diagnosis not present
